# Patient Record
Sex: Female | Born: 1995 | Race: White | Hispanic: No | Marital: Single | Smoking: Never smoker
Health system: Southern US, Community
[De-identification: ages and names within clinical notes are randomized; demographics above are authoritative.]

## PROBLEM LIST (undated history)

## (undated) ENCOUNTER — Ambulatory Visit: Admission: EM | Payer: Self-pay

## (undated) DIAGNOSIS — C569 Malignant neoplasm of unspecified ovary: Secondary | ICD-10-CM

---

## 2015-07-23 ENCOUNTER — Emergency Department (HOSPITAL_COMMUNITY): Payer: Medicaid Other

## 2015-07-23 ENCOUNTER — Emergency Department (HOSPITAL_COMMUNITY)
Admission: EM | Admit: 2015-07-23 | Discharge: 2015-07-23 | Disposition: A | Discharge status: Home / Self Care | Payer: Medicaid Other | Attending: Emergency Medicine | Admitting: Emergency Medicine

## 2015-07-23 ENCOUNTER — Encounter (HOSPITAL_COMMUNITY): Payer: Self-pay | Admitting: Emergency Medicine

## 2015-07-23 DIAGNOSIS — S93401A Sprain of unspecified ligament of right ankle, initial encounter: Secondary | ICD-10-CM | POA: Insufficient documentation

## 2015-07-23 DIAGNOSIS — S99911A Unspecified injury of right ankle, initial encounter: Secondary | ICD-10-CM | POA: Diagnosis present

## 2015-07-23 DIAGNOSIS — Y9389 Activity, other specified: Secondary | ICD-10-CM | POA: Diagnosis not present

## 2015-07-23 DIAGNOSIS — Y9289 Other specified places as the place of occurrence of the external cause: Secondary | ICD-10-CM | POA: Diagnosis not present

## 2015-07-23 DIAGNOSIS — Y998 Other external cause status: Secondary | ICD-10-CM | POA: Insufficient documentation

## 2015-07-23 DIAGNOSIS — S9001XA Contusion of right ankle, initial encounter: Secondary | ICD-10-CM | POA: Diagnosis not present

## 2015-07-23 MED ORDER — IBUPROFEN 600 MG PO TABS: 600.0000 mg | ORAL_TABLET | Freq: Four times a day (QID) | ORAL | Status: AC | PRN

## 2015-07-23 NOTE — ED Notes (Signed)
.  den
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
## 2015-07-23 NOTE — Discharge Instructions (Signed)
Ankle Sprain  An ankle sprain is an injury to the strong, fibrous tissues (ligaments) that hold the bones of your ankle joint together.   CAUSES  An ankle sprain is usually caused by a fall or by twisting your ankle. Ankle sprains most commonly occur when you step on the outer edge of your foot, and your ankle turns inward. People who participate in sports are more prone to these types of injuries.   SYMPTOMS    Pain in your ankle. The pain may be present at rest or only when you are trying to stand or walk.   Swelling.   Bruising. Bruising may develop immediately or within 1 to 2 days after your injury.   Difficulty standing or walking, particularly when turning corners or changing directions.  DIAGNOSIS   Your caregiver will ask you details about your injury and perform a physical exam of your ankle to determine if you have an ankle sprain. During the physical exam, your caregiver will press on and apply pressure to specific areas of your foot and ankle. Your caregiver will try to move your ankle in certain ways. An X-ray exam may be done to be sure a bone was not broken or a ligament did not separate from one of the bones in your ankle (avulsion fracture).   TREATMENT   Certain types of braces can help stabilize your ankle. Your caregiver can make a recommendation for this. Your caregiver may recommend the use of medicine for pain. If your sprain is severe, your caregiver may refer you to a surgeon who helps to restore function to parts of your skeletal system (orthopedist) or a physical therapist.  HOME CARE INSTRUCTIONS    Apply ice to your injury for 1-2 days or as directed by your caregiver. Applying ice helps to reduce inflammation and pain.    Put ice in a plastic bag.    Place a towel between your skin and the bag.    Leave the ice on for 15-20 minutes at a time, every 2 hours while you are awake.   Only take over-the-counter or prescription medicines for pain, discomfort, or fever as directed by  your caregiver.   Elevate your injured ankle above the level of your heart as much as possible for 2-3 days.   If your caregiver recommends crutches, use them as instructed. Gradually put weight on the affected ankle. Continue to use crutches or a cane until you can walk without feeling pain in your ankle.   If you have a plaster splint, wear the splint as directed by your caregiver. Do not rest it on anything harder than a pillow for the first 24 hours. Do not put weight on it. Do not get it wet. You may take it off to take a shower or bath.   You may have been given an elastic bandage to wear around your ankle to provide support. If the elastic bandage is too tight (you have numbness or tingling in your foot or your foot becomes cold and blue), adjust the bandage to make it comfortable.   If you have an air splint, you may blow more air into it or let air out to make it more comfortable. You may take your splint off at night and before taking a shower or bath. Wiggle your toes in the splint several times per day to decrease swelling.  SEEK MEDICAL CARE IF:    You have rapidly increasing bruising or swelling.   Your toes feel   extremely cold or you lose feeling in your foot.   Your pain is not relieved with medicine.  SEEK IMMEDIATE MEDICAL CARE IF:   Your toes are numb or blue.   You have severe pain that is increasing.  MAKE SURE YOU:    Understand these instructions.   Will watch your condition.   Will get help right away if you are not doing well or get worse.     This information is not intended to replace advice given to you by your health care provider. Make sure you discuss any questions you have with your health care provider.     Document Released: 06/04/2005 Document Revised: 06/25/2014 Document Reviewed: 06/16/2011  Elsevier Interactive Patient Education 2016 [employer]
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
## 2015-08-18 ENCOUNTER — Encounter (HOSPITAL_COMMUNITY): Payer: Self-pay | Admitting: *Deleted

## 2015-08-18 ENCOUNTER — Emergency Department (HOSPITAL_COMMUNITY)
Admission: EM | Admit: 2015-08-18 | Discharge: 2015-08-19 | Disposition: A | Discharge status: Home / Self Care | Payer: Medicaid Other | Attending: Emergency Medicine | Admitting: Emergency Medicine

## 2015-08-18 DIAGNOSIS — R112 Nausea with vomiting, unspecified: Secondary | ICD-10-CM | POA: Diagnosis not present

## 2015-08-18 DIAGNOSIS — R1032 Left lower quadrant pain: Secondary | ICD-10-CM | POA: Diagnosis not present

## 2015-08-18 DIAGNOSIS — R109 Unspecified abdominal pain: Secondary | ICD-10-CM | POA: Diagnosis present

## 2015-08-18 HISTORY — DX: Malignant neoplasm of unspecified ovary: C56.9

## 2015-08-18 LAB — COMPREHENSIVE METABOLIC PANEL
ALBUMIN: 4.3 g/dL (ref 3.5–5.0)
ALK PHOS: 96 U/L (ref 38–126)
ALT: 23 U/L (ref 14–54)
AST: 23 U/L (ref 15–41)
Anion gap: 9 (ref 5–15)
BUN: 23 mg/dL — AB (ref 6–20)
CHLORIDE: 104 mmol/L (ref 101–111)
CO2: 29 mmol/L (ref 22–32)
CREATININE: 0.64 mg/dL (ref 0.44–1.00)
Calcium: 9.4 mg/dL (ref 8.9–10.3)
GFR calc Af Amer: >60 mL/min (ref >60)
GFR calc non Af Amer: >60 mL/min (ref >60)
Glucose, Bld: 113 mg/dL — ABNORMAL HIGH (ref 65–99)
Potassium: 3.8 mmol/L (ref 3.5–5.1)
SODIUM: 142 mmol/L (ref 135–145)
TOTAL PROTEIN: 7.3 g/dL (ref 6.5–8.1)
Total Bilirubin: 0.3 mg/dL (ref 0.3–1.2)

## 2015-08-18 LAB — LIPASE, BLOOD: LIPASE: 24 U/L (ref 11–51)

## 2015-08-18 LAB — CBC
HCT: 38.6 % (ref 36.0–46.0)
Hemoglobin: 12.7 g/dL (ref 12.0–15.0)
MCH: 28.7 pg (ref 26.0–34.0)
MCHC: 32.9 g/dL (ref 30.0–36.0)
MCV: 87.1 fL (ref 78.0–100.0)
PLATELETS: 175 10*3/uL (ref 150–400)
RBC: 4.43 MIL/uL (ref 3.87–5.11)
RDW: 13.2 % (ref 11.5–15.5)
WBC: 7.5 10*3/uL (ref 4.0–10.5)

## 2015-08-18 MED ORDER — ONDANSETRON 4 MG PO TBDP
4.0000 mg | ORAL_TABLET | Freq: Once | ORAL | Status: AC | PRN
  Administered 2015-08-18: 4 mg via ORAL
  Filled 2015-08-18: qty 1

## 2015-08-18 NOTE — ED Notes (Signed)
Pt states she had LLQ abdominal pain, then vomiting at 10PM tonight. Pt denies diarrhea. Pt states she still feels nauseas at this time.

## 2015-08-19 NOTE — ED Notes (Signed)
This Probation officer informed by registration that patient has left.
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
## 2024-07-03 ENCOUNTER — Other Ambulatory Visit: Payer: Self-pay

## 2024-07-03 ENCOUNTER — Emergency Department (HOSPITAL_BASED_OUTPATIENT_CLINIC_OR_DEPARTMENT_OTHER)
Admission: EM | Admit: 2024-07-03 | Discharge: 2024-07-03 | Disposition: A | Discharge status: Home / Self Care | Attending: Emergency Medicine | Admitting: Emergency Medicine

## 2024-07-03 ENCOUNTER — Emergency Department (HOSPITAL_BASED_OUTPATIENT_CLINIC_OR_DEPARTMENT_OTHER)

## 2024-07-03 ENCOUNTER — Encounter (HOSPITAL_BASED_OUTPATIENT_CLINIC_OR_DEPARTMENT_OTHER): Payer: Self-pay | Admitting: Emergency Medicine

## 2024-07-03 DIAGNOSIS — R112 Nausea with vomiting, unspecified: Secondary | ICD-10-CM | POA: Insufficient documentation

## 2024-07-03 DIAGNOSIS — R197 Diarrhea, unspecified: Secondary | ICD-10-CM | POA: Insufficient documentation

## 2024-07-03 DIAGNOSIS — R1012 Left upper quadrant pain: Secondary | ICD-10-CM | POA: Insufficient documentation

## 2024-07-03 DIAGNOSIS — Z91040 Latex allergy status: Secondary | ICD-10-CM | POA: Diagnosis not present

## 2024-07-03 DIAGNOSIS — R1013 Epigastric pain: Secondary | ICD-10-CM | POA: Insufficient documentation

## 2024-07-03 DIAGNOSIS — Z8543 Personal history of malignant neoplasm of ovary: Secondary | ICD-10-CM | POA: Insufficient documentation

## 2024-07-03 DIAGNOSIS — R109 Unspecified abdominal pain: Secondary | ICD-10-CM

## 2024-07-03 DIAGNOSIS — R1011 Right upper quadrant pain: Secondary | ICD-10-CM | POA: Diagnosis present

## 2024-07-03 LAB — COMPREHENSIVE METABOLIC PANEL WITH GFR
ALT: 20 U/L (ref 0–44)
AST: 26 U/L (ref 15–41)
Albumin: 4.2 g/dL (ref 3.5–5.0)
Alkaline Phosphatase: 92 U/L (ref 38–126)
Anion gap: 13 (ref 5–15)
BUN: 20 mg/dL (ref 6–20)
CO2: 24 mmol/L (ref 22–32)
Calcium: 9.5 mg/dL (ref 8.9–10.3)
Chloride: 104 mmol/L (ref 98–111)
Creatinine, Ser: 0.72 mg/dL (ref 0.44–1.00)
GFR, Estimated: >60 mL/min
Glucose, Bld: 105 mg/dL — ABNORMAL HIGH (ref 70–99)
Potassium: 4.1 mmol/L (ref 3.5–5.1)
Sodium: 141 mmol/L (ref 135–145)
Total Bilirubin: 0.4 mg/dL (ref 0.0–1.2)
Total Protein: 7.7 g/dL (ref 6.5–8.1)

## 2024-07-03 LAB — CBC
HCT: 42.2 % (ref 36.0–46.0)
Hemoglobin: 13.7 g/dL (ref 12.0–15.0)
MCH: 26.9 pg (ref 26.0–34.0)
MCHC: 32.5 g/dL (ref 30.0–36.0)
MCV: 82.7 fL (ref 80.0–100.0)
Platelets: 333 K/uL (ref 150–400)
RBC: 5.1 MIL/uL (ref 3.87–5.11)
RDW: 14.7 % (ref 11.5–15.5)
WBC: 13 K/uL — ABNORMAL HIGH (ref 4.0–10.5)
nRBC: 0 % (ref 0.0–0.2)

## 2024-07-03 LAB — LIPASE, BLOOD: Lipase: 42 U/L (ref 11–51)

## 2024-07-03 LAB — HCG, SERUM, QUALITATIVE: Preg, Serum: NEGATIVE

## 2024-07-03 MED ORDER — METOCLOPRAMIDE HCL 10 MG PO TABS: 10.0000 mg | ORAL_TABLET | Freq: Four times a day (QID) | ORAL | 0 refills | Status: AC

## 2024-07-03 MED ORDER — ONDANSETRON HCL 4 MG/2ML IJ SOLN
4.0000 mg | Freq: Once | INTRAMUSCULAR | Status: AC
  Administered 2024-07-03: 4 mg via INTRAVENOUS
  Filled 2024-07-03: qty 2

## 2024-07-03 MED ORDER — IOHEXOL 300 MG/ML  SOLN
100.0000 mL | Freq: Once | INTRAMUSCULAR | Status: AC | PRN
  Administered 2024-07-03: 100 mL via INTRAVENOUS

## 2024-07-03 MED ORDER — SODIUM CHLORIDE 0.9 % IV BOLUS: 1000.0000 mL | Freq: Once | INTRAVENOUS | Status: DC

## 2024-07-03 MED ORDER — SODIUM CHLORIDE 0.9 % IV BOLUS
1000.0000 mL | Freq: Once | INTRAVENOUS | Status: AC
  Administered 2024-07-03: 1000 mL via INTRAVENOUS

## 2024-07-03 MED ORDER — MORPHINE SULFATE (PF) 4 MG/ML IV SOLN
4.0000 mg | Freq: Once | INTRAVENOUS | Status: AC
  Administered 2024-07-03: 4 mg via INTRAVENOUS
  Filled 2024-07-03: qty 1

## 2024-07-03 MED ORDER — METOCLOPRAMIDE HCL 5 MG/ML IJ SOLN
10.0000 mg | Freq: Once | INTRAMUSCULAR | Status: AC
  Administered 2024-07-03: 10 mg via INTRAVENOUS
  Filled 2024-07-03: qty 2

## 2024-07-03 NOTE — ED Notes (Signed)
 Pt declined the IV fluids. PA aware.

## 2024-07-03 NOTE — ED Provider Notes (Cosign Needed Addendum)
 " [place] [facility] Provider Note   CSN: [id number] Arrival date & time: 07/03/24  1044     Patient presents with: Abdominal Cramping and Abdominal Pain (LUQ)  HPI Ashlee Brewer is a 29 y.o. female with h/o ovarian cancer status post oophorectomy presenting for abdominal pain.  Symptoms started acutely with nausea vomiting and diarrhea this morning.  Pain is in the left upper quadrant primarily but she does have pain in the right upper quadrant as well.  Denies urinary or vaginal symptoms.  Denies sick contacts.    Abdominal Cramping Associated symptoms include abdominal pain.  Abdominal Pain      Prior to Admission medications  Medication Sig Start Date End Date Taking? Authorizing Provider  metoCLOPramide  (REGLAN ) 10 MG tablet Take 1 tablet (10 mg total) by mouth every 6 (six) hours. 07/03/24  Yes Lang Norleen POUR, PA-C  ibuprofen  (ADVIL ,MOTRIN ) 600 MG tablet Take 1 tablet (600 mg total) by mouth every 6 (six) hours as needed. 07/23/15   Levora Riggs, PA-C    Allergies: Latex    Review of Systems  Gastrointestinal:  Positive for abdominal pain.    Updated Vital Signs BP (!) 116/91   Pulse 97   Temp 98.1 F (36.7 C)   Resp 16   Wt 81.6 kg   SpO2 100%   Physical Exam Vitals and nursing note reviewed.  HENT:     Head: Normocephalic and atraumatic.     Mouth/Throat:     Mouth: Mucous membranes are moist.  Eyes:     General:        Right eye: No discharge.        Left eye: No discharge.     Conjunctiva/sclera: Conjunctivae normal.  Cardiovascular:     Rate and Rhythm: Normal rate and regular rhythm.     Pulses: Normal pulses.     Heart sounds: Normal heart sounds.  Pulmonary:     Effort: Pulmonary effort is normal.     Breath sounds: Normal breath sounds.  Abdominal:     General: Abdomen is flat.     Palpations: Abdomen is soft.     Tenderness: There is abdominal tenderness in the right upper quadrant, epigastric area  and left upper quadrant.  Skin:    General: Skin is warm and dry.  Neurological:     General: No focal deficit present.  Psychiatric:        Mood and Affect: Mood normal.     (all labs ordered are listed, but only abnormal results are displayed) Labs Reviewed  COMPREHENSIVE METABOLIC PANEL WITH GFR - Abnormal; Notable for the following components:      Result Value   Glucose, Bld 105 (*)    All other components within normal limits  CBC - Abnormal; Notable for the following components:   WBC 13.0 (*)    All other components within normal limits  LIPASE, BLOOD  HCG, SERUM, QUALITATIVE  URINALYSIS, ROUTINE W REFLEX MICROSCOPIC  PREGNANCY, URINE    EKG: None  Radiology: CT ABDOMEN PELVIS W CONTRAST Result Date: 07/03/2024 CLINICAL DATA:  Acute generalized abdominal pain, vomiting EXAM: CT ABDOMEN AND PELVIS WITH CONTRAST TECHNIQUE: Multidetector CT imaging of the abdomen and pelvis was performed using the standard protocol following bolus administration of intravenous contrast. RADIATION DOSE REDUCTION: This exam was performed according to the departmental dose-optimization program which includes automated exposure control, adjustment of the mA and/or kV according to patient size and/or use of iterative reconstruction  technique. CONTRAST:  OMNIPAQUE  IOHEXOL  300 MG/ML  SOLN COMPARISON:  None Available. FINDINGS: Lower chest: No acute abnormality. Hepatobiliary: No focal liver abnormality is seen. No gallstones, gallbladder wall thickening, or biliary dilatation. Pancreas: Unremarkable. No pancreatic ductal dilatation or surrounding inflammatory changes. Spleen: Normal in size without focal abnormality. Adrenals/Urinary Tract: Adrenal glands are unremarkable. Kidneys are normal, without renal calculi, focal lesion, or hydronephrosis. Bladder is unremarkable. Stomach/Bowel: Stomach is within normal limits. Appendix appears normal. No evidence of bowel wall thickening, distention, or  inflammatory changes. Vascular/Lymphatic: No significant vascular findings are present. No enlarged abdominal or pelvic lymph nodes. Reproductive: Uterus is unremarkable. Reportedly status post bilateral oophorectomy. No definite acute adnexal abnormality. Other: No abdominal wall hernia or abnormality. No abdominopelvic ascites. Musculoskeletal: No acute or significant osseous findings. IMPRESSION: No acute abnormality seen in the abdomen or pelvis. Electronically Signed   By: Lynwood Landy Raddle M.D.   On: 07/03/2024 13:12     .Ultrasound ED Peripheral IV (Provider)  Date/Time: 07/03/2024 11:17 AM  Performed by: Lang Norleen POUR, PA-C Authorized by: Lang Norleen POUR, PA-C   Procedure details:    Indications: multiple failed IV attempts     Skin Prep: isopropyl alcohol     Location:  Right AC   Angiocath:  18 G   Bedside Ultrasound Guided: Yes     Images: not archived     Patient tolerated procedure without complications: Yes     Dressing applied: Yes      Medications Ordered in the ED  sodium chloride  0.9 % bolus 1,000 mL (0 mLs Intravenous Stopped 07/03/24 1442)  ondansetron  (ZOFRAN ) injection 4 mg (4 mg Intravenous Given 07/03/24 1132)  morphine  (PF) 4 MG/ML injection 4 mg (4 mg Intravenous Given 07/03/24 1133)  iohexol  (OMNIPAQUE ) 300 MG/ML solution 100 mL (100 mLs Intravenous Contrast Given 07/03/24 1235)  ondansetron  (ZOFRAN ) injection 4 mg (4 mg Intravenous Given 07/03/24 1352)  metoCLOPramide  (REGLAN ) injection 10 mg (10 mg Intravenous Given 07/03/24 1446)                                    Medical Decision Making Amount and/or Complexity of Data Reviewed Labs: ordered. Radiology: ordered.  Risk Prescription drug management.   Initial Impression and Ddx 29 year old well-appearing female presenting for abdominal pain nausea vomiting diarrhea.  Exam notable for upper abdominal tenderness.  DDx includes acute cholecystitis, acute pancreatitis, kidney stone, bowel obstruction,  ectopic pregnancy, ovarian torsion, other. Patient PMH that increases complexity of ED encounter: h/o ovarian cancer status post oophorectomy  Interpretation of Diagnostics - I independent reviewed and interpreted the labs as followed: WBC 13.0  - I independently visualized the following imaging with scope of interpretation limited to determining acute life threatening conditions related to emergency care: CT, which revealed no acute abnormality  Patient Reassessment and Ultimate Disposition/Management Vomited multiple times after Zofran .  Gave Reglan  and able to fluid challenge and has not vomited since.  She states she is overall feeling better.  Due to the acute onset of her symptoms with nausea vomiting diarrhea and reassuring CT scan and workup.  Feel this is likely viral gastroenteritis.  Able to fluid challenge.  Advise supportive care at home.  Sent a few tablets of Reglan  to your pharmacy.  Advised PCP follow-up.  Discussed return precautions.  Discharged.  Patient management required discussion with the following services or consulting groups:  None  Complexity of Problems  Addressed Acute complicated illness or Injury  Additional Data Reviewed and Analyzed Further history obtained from: Past medical history and medications listed in the EMR and Prior ED visit notes  Patient Encounter Risk Assessment Consideration of hospitalization      Final diagnoses:  Abdominal pain, unspecified abdominal location    ED Discharge Orders          Ordered    metoCLOPramide  (REGLAN ) 10 MG tablet  Every 6 hours        07/03/24 1546               Tykiera Raven K, PA-C 07/03/24 1548    Lang Norleen POUR, PA-C 07/03/24 1625  "

## 2024-07-03 NOTE — Discharge Instructions (Addendum)
 Evaluation today was reassuring.  Suspect this is likely viral or foodborne illness.  Treatment is the same which is supportive at home which includes rest, assertive hydration and advancing her diet as tolerated.  I sent a few tablets of Reglan  to your pharmacy to help with nausea vomiting at home.  If your abdominal pain worsens, you develop a fever, cannot tolerate fluid intake or any other concerning symptom please return to ED for further evaluation.  Otherwise recommend PCP follow-up.

## 2024-07-03 NOTE — ED Triage Notes (Signed)
 LUQ pain , emesis and diarrhea since this morning . No urinary symptoms . No Hx kidney stones .
